# Patient Record
Sex: Male | Born: 1990 | Race: Black or African American | Hispanic: No | Marital: Single
Health system: Southern US, Community
[De-identification: ages and names within clinical notes are randomized; demographics above are authoritative.]

---

## 2015-04-06 ENCOUNTER — Encounter (HOSPITAL_COMMUNITY): Payer: Self-pay | Admitting: Emergency Medicine

## 2015-04-06 ENCOUNTER — Emergency Department (HOSPITAL_COMMUNITY): Payer: BLUE CROSS/BLUE SHIELD

## 2015-04-06 ENCOUNTER — Emergency Department (HOSPITAL_COMMUNITY)
Admission: EM | Admit: 2015-04-06 | Discharge: 2015-04-06 | Disposition: A | Discharge status: Home / Self Care | Payer: BLUE CROSS/BLUE SHIELD | Attending: Emergency Medicine | Admitting: Emergency Medicine

## 2015-04-06 DIAGNOSIS — S0083XA Contusion of other part of head, initial encounter: Secondary | ICD-10-CM

## 2015-04-06 DIAGNOSIS — S0242XA Fracture of alveolus of maxilla, initial encounter for closed fracture: Secondary | ICD-10-CM | POA: Diagnosis not present

## 2015-04-06 DIAGNOSIS — Y998 Other external cause status: Secondary | ICD-10-CM | POA: Insufficient documentation

## 2015-04-06 DIAGNOSIS — S80212A Abrasion, left knee, initial encounter: Secondary | ICD-10-CM | POA: Insufficient documentation

## 2015-04-06 DIAGNOSIS — F1010 Alcohol abuse, uncomplicated: Secondary | ICD-10-CM | POA: Insufficient documentation

## 2015-04-06 DIAGNOSIS — S60511A Abrasion of right hand, initial encounter: Secondary | ICD-10-CM | POA: Insufficient documentation

## 2015-04-06 DIAGNOSIS — S01511A Laceration without foreign body of lip, initial encounter: Secondary | ICD-10-CM

## 2015-04-06 DIAGNOSIS — Y9289 Other specified places as the place of occurrence of the external cause: Secondary | ICD-10-CM | POA: Insufficient documentation

## 2015-04-06 DIAGNOSIS — S60512A Abrasion of left hand, initial encounter: Secondary | ICD-10-CM | POA: Diagnosis not present

## 2015-04-06 DIAGNOSIS — Y9389 Activity, other specified: Secondary | ICD-10-CM | POA: Diagnosis not present

## 2015-04-06 DIAGNOSIS — S01521A Laceration with foreign body of lip, initial encounter: Secondary | ICD-10-CM | POA: Diagnosis not present

## 2015-04-06 DIAGNOSIS — S02401A Maxillary fracture, unspecified, initial encounter for closed fracture: Secondary | ICD-10-CM

## 2015-04-06 DIAGNOSIS — S0993XA Unspecified injury of face, initial encounter: Secondary | ICD-10-CM | POA: Diagnosis present

## 2015-04-06 DIAGNOSIS — K08409 Partial loss of teeth, unspecified cause, unspecified class: Secondary | ICD-10-CM | POA: Insufficient documentation

## 2015-04-06 DIAGNOSIS — S20319A Abrasion of unspecified front wall of thorax, initial encounter: Secondary | ICD-10-CM | POA: Insufficient documentation

## 2015-04-06 MED ORDER — HYDROCODONE-ACETAMINOPHEN 5-325 MG PO TABS
1.0000 | ORAL_TABLET | Freq: Once | ORAL | Status: AC
  Administered 2015-04-06: 1 via ORAL
  Filled 2015-04-06: qty 1

## 2015-04-06 MED ORDER — LIDOCAINE HCL (PF) 1 % IJ SOLN
5.0000 mL | Freq: Once | INTRAMUSCULAR | Status: AC
  Administered 2015-04-06: 5 mL
  Filled 2015-04-06: qty 5

## 2015-04-06 MED ORDER — AMOXICILLIN-POT CLAVULANATE 875-125 MG PO TABS: 1.0000 | ORAL_TABLET | Freq: Two times a day (BID) | ORAL | Status: AC

## 2015-04-06 MED ORDER — LIDOCAINE HCL (PF) 1 % IJ SOLN: 5.0000 mL | Freq: Once | INTRAMUSCULAR | Status: DC

## 2015-04-06 MED ORDER — HYDROCODONE-ACETAMINOPHEN 5-325 MG PO TABS: 1.0000 | ORAL_TABLET | ORAL | Status: AC | PRN

## 2015-04-06 MED ORDER — NAPROXEN 500 MG PO TABS: 500.0000 mg | ORAL_TABLET | Freq: Two times a day (BID) | ORAL | Status: AC

## 2015-04-06 MED ORDER — ONDANSETRON 4 MG PO TBDP: 4.0000 mg | ORAL_TABLET | Freq: Three times a day (TID) | ORAL | Status: AC | PRN

## 2015-04-06 MED ORDER — LIDOCAINE HCL (PF) 1 % IJ SOLN
5.0000 mL | Freq: Once | INTRAMUSCULAR | Status: DC
  Filled 2015-04-06: qty 5
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
